# Patient Record
Sex: Female | Born: 1975 | Race: White | Hispanic: No | Marital: Married | Smoking: Never smoker
Health system: Southern US, Community
[De-identification: ages and names within clinical notes are randomized; demographics above are authoritative.]

## PROBLEM LIST (undated history)

## (undated) DIAGNOSIS — N879 Dysplasia of cervix uteri, unspecified: Secondary | ICD-10-CM

## (undated) HISTORY — PX: CRYOTHERAPY: SHX1416

## (undated) HISTORY — DX: Dysplasia of cervix uteri, unspecified: N87.9

## (undated) HISTORY — PX: HIP SURGERY: SHX245

---

## 2013-09-07 ENCOUNTER — Ambulatory Visit: Payer: Self-pay | Admitting: Podiatry

## 2015-09-16 ENCOUNTER — Encounter: Payer: Self-pay | Admitting: Sports Medicine

## 2015-09-16 ENCOUNTER — Ambulatory Visit (INDEPENDENT_AMBULATORY_CARE_PROVIDER_SITE_OTHER): Payer: BC Managed Care – PPO | Admitting: Sports Medicine

## 2015-09-16 ENCOUNTER — Ambulatory Visit (INDEPENDENT_AMBULATORY_CARE_PROVIDER_SITE_OTHER): Payer: BC Managed Care – PPO

## 2015-09-16 DIAGNOSIS — M79673 Pain in unspecified foot: Secondary | ICD-10-CM | POA: Diagnosis not present

## 2015-09-16 DIAGNOSIS — M779 Enthesopathy, unspecified: Secondary | ICD-10-CM

## 2015-09-16 DIAGNOSIS — L603 Nail dystrophy: Secondary | ICD-10-CM

## 2015-09-16 MED ORDER — MELOXICAM 15 MG PO TABS: 15.0000 mg | ORAL_TABLET | Freq: Every day | ORAL | Status: DC

## 2015-09-16 NOTE — Progress Notes (Signed)
Patient ID: April Leach, female   DOB: 1975-09-17, 40 y.o.   MRN: [id number] Subjective: April Leach is a 40 y.o. female patient who presents to office for evaluation of yellow and discolored nails and occassional bilateral foot pain. Patient states that a few years ago she was put on lamisil and the nails cleared up, states that 1.5 year ago noticed her nails starting to change again and was put on another course of lamisil with no improvement. Patinet states that she had a past + fungus culture. Patient also admits to chronic pain for many years to the sides of both feet only when she walks; reports that it feels sharp to achy; Denies injury/trip/fall/sprain/any known causative factors.   There are no active problems to display for this patient.  No current outpatient prescriptions on file prior to visit.   No current facility-administered medications on file prior to visit.   Allergies  Allergen Reactions  . Meperidine Itching    Objective:  General: Alert and oriented x3 in no acute distress  Dermatology: No open lesions bilateral lower extremities, no webspace macerations, no ecchymosis bilateral, all nails x 10 are mildly discolored with minimal subungal debris with hallux and 2nd toe nails most involved bilateral.  Vascular: Dorsalis Pedis and Posterior Tibial pedal pulses 2/4, Capillary Fill Time 3 seconds,(+) pedal hair growth bilateral, no edema bilateral lower extremities, Temperature gradient within normal limits.  Neurology: Gross sensation intact via light touch bilateral. (- )Tinels sign.   Musculoskeletal: Mild tenderness with palpation at insertion of peroneus brevis bilateral,No pain with calf compression bilateral. Ankle and pedal joint range of motion is within normal limits, there is no 1st ray hypermobility noted bilateral ,normal arch and foot type. Strength within normal limits in all groups bilateral.   Gait: Non-Antalgic gait with increased lateral column  loading.  Xrays  Right and Left Foot 2 views    Impression: Normal osseous mineralization, joint spaces well preserved, no fracture, dislocation, normal bipartite sesamoid, soft tissues within normal limits. No foreign bodies, no acute findings.        Assessment and Plan: Problem List Items Addressed This Visit    None    Visit Diagnoses    Nail dystrophy    -  Primary    Relevant Orders    Fungus Culture with Smear    Foot pain, unspecified laterality        Relevant Medications    meloxicam (MOBIC) 15 MG tablet    Other Relevant Orders    DG Foot 2 Views Left    DG Foot 2 Views Right    Fungus Culture with Smear    Tendonitis        PB bilateral    Relevant Medications    meloxicam (MOBIC) 15 MG tablet    Other Relevant Orders    Fungus Culture with Smear       -Complete examination performed -Xrays reviewed -Discussed treatement options -Fungal culture obtained from all nails and sent to [facility]; will determine treatment based on results -Rx Mobic for PB tendonitis -Recommend good supportive shoes and inserts daily. If continues to be symptomatic advised patient to consider custom orthotics -Patient to return to office 3 weeks to discuss symptoms and fungal results or sooner if condition worsens.  Landis Martins, DPM

## 2015-09-16 NOTE — Progress Notes (Deleted)
   Subjective:    Patient ID: April Leach, female    DOB: 05/12/1976, 40 y.o.   MRN: [id number]  HPI    Review of Systems  All other systems reviewed and are negative.      Objective:   Physical Exam        Assessment & Plan:

## 2015-10-04 ENCOUNTER — Telehealth: Payer: Self-pay | Admitting: *Deleted

## 2015-10-04 NOTE — Telephone Encounter (Addendum)
Pt states she is cancelling 10/07/2015 appt due to scheduling conflict and would like to discuss results and treatment without an appt.  I called for [place] fungal culture results to present to Dr. Cannon Kettle.  10/05/2015-LEFT MESSAGE encouraging pt to reschedule an appt with Dr. Cannon Kettle to discuss results and treatment.

## 2015-10-07 ENCOUNTER — Ambulatory Visit: Payer: BC Managed Care – PPO | Admitting: Sports Medicine

## 2015-10-26 ENCOUNTER — Encounter: Payer: Self-pay | Admitting: Sports Medicine

## 2020-05-03 ENCOUNTER — Telehealth: Payer: Self-pay | Admitting: Obstetrics and Gynecology

## 2020-05-03 NOTE — Telephone Encounter (Signed)
Patient coming in on 05/09/2020 at 2:10 with [facility] for mirena removal and placement

## 2020-05-04 NOTE — Telephone Encounter (Signed)
Noted. Mirena reserved for this patient. 

## 2020-05-09 ENCOUNTER — Ambulatory Visit (INDEPENDENT_AMBULATORY_CARE_PROVIDER_SITE_OTHER): Payer: 59 | Admitting: Obstetrics and Gynecology

## 2020-05-09 ENCOUNTER — Encounter: Payer: Self-pay | Admitting: Obstetrics and Gynecology

## 2020-05-09 ENCOUNTER — Other Ambulatory Visit (HOSPITAL_COMMUNITY)
Admission: RE | Admit: 2020-05-09 | Discharge: 2020-05-09 | Disposition: A | Discharge status: Home / Self Care | Payer: 59 | Source: Ambulatory Visit | Attending: Obstetrics and Gynecology | Admitting: Obstetrics and Gynecology

## 2020-05-09 ENCOUNTER — Other Ambulatory Visit: Payer: Self-pay

## 2020-05-09 VITALS — BP 140/90 | Ht 69.0 in | Wt 224.0 lb

## 2020-05-09 DIAGNOSIS — Z124 Encounter for screening for malignant neoplasm of cervix: Secondary | ICD-10-CM

## 2020-05-09 DIAGNOSIS — Z1151 Encounter for screening for human papillomavirus (HPV): Secondary | ICD-10-CM | POA: Diagnosis not present

## 2020-05-09 DIAGNOSIS — Z30433 Encounter for removal and reinsertion of intrauterine contraceptive device: Secondary | ICD-10-CM

## 2020-05-09 LAB — POCT URINE PREGNANCY: Preg Test, Ur: NEGATIVE

## 2020-05-09 MED ORDER — LEVONORGESTREL 20 MCG/24HR IU IUD: 1.0000 | INTRAUTERINE_SYSTEM | Freq: Once | INTRAUTERINE | 0 refills | Status: AC

## 2020-05-09 NOTE — Patient Instructions (Addendum)
I value your feedback and entrusting us with your care. If you get a [facility] patient survey, I would appreciate you taking the time to let us know about your experience today. Thank you!  As of August 06, 2019, your lab results will be released to your MyChart immediately, before I even have a chance to see them. Please give me time to review them and contact you if there are any abnormalities. Thank you for your patience.   [facility] [phone]  Instructions after IUD insertion  Most women experience no significant problems after insertion of an IUD, however minor cramping and spotting for a few days is common. Cramps may be treated with ibuprofen 800mg every 8 hours or Tylenol 650 mg every 4 hours. Contact [facility] immediately if you experience any of the following symptoms during the next week: temperature >99.6 degrees, worsening pelvic pain, abdominal pain, fainting, unusually heavy vaginal bleeding, foul vaginal discharge, or if you think you have expelled the IUD.  Nothing inserted in the vagina for 48 hours. You will be scheduled for a follow up visit in approximately four weeks.  You should check monthly to be sure you can feel the IUD strings in the upper vagina. If you are having a monthly period, try to check after each period. If you cannot feel the IUD strings,  contact [facility] immediately so we can do an exam to determine if the IUD has been expelled.   Please use backup protection until we can confirm the IUD is in place.  Call [facility] if you are exposed to or diagnosed with a sexually transmitted infection, as we will need to discuss whether it is safe for you to continue using an IUD.   

## 2020-05-09 NOTE — Progress Notes (Signed)
[place], [facility]   Chief Complaint  Patient presents with  . Contraception    Mirena removal/reinsertion    HPI:      [name] is a 44 y.o. G1P1001 whose LMP was Patient's last menstrual period was 04/22/2020 (exact date)., presents today for NP> 3 yrs Mirena replacement. Mirena REplaced 01/01/14. Pt has monthly menses, lasting 5-7 days, mod flow, occas BTB recently (since IUD expired), and mild dysmen, improved with ibup. She would like replacement. She is sex active, no pain/bleeding. Last pap 10/29/08 was neg cells, neg HPV DNA. Hx of cryotherapy in past. No recent pap. No recent annual or mammo.    Past Medical History:  Diagnosis Date  . Cervical dysplasia     Past Surgical History:  Procedure Laterality Date  . CRYOTHERAPY    . HIP SURGERY     x2    Family History  Problem Relation Age of Onset  . Cervical cancer Mother 88  . Hypertension Mother   . Lung cancer Father 64  . Parkinson's disease Maternal Grandmother        Family History  . Melanoma Paternal Grandfather     Social History   Socioeconomic History  . Marital status: Married    Spouse name: Not on file  . Number of children: Not on file  . Years of education: Not on file  . Highest education level: Not on file  Occupational History  . Not on file  Tobacco Use  . Smoking status: Never Smoker  . Smokeless tobacco: Never Used  Vaping Use  . Vaping Use: Never used  Substance and Sexual Activity  . Alcohol use: Not Currently    Alcohol/week: 0.0 standard drinks  . Drug use: Never  . Sexual activity: Yes    Birth control/protection: I.U.D.    Comment: Mirena  Other Topics Concern  . Not on file  Social History Narrative  . Not on file   Social Determinants of Health   Financial Resource Strain:   . Difficulty of Paying Living Expenses: Not on file  Food Insecurity:   . Worried About Charity fundraiser in the Last Year: Not on file  . Ran Out of Food in the Last  Year: Not on file  Transportation Leach:   . Lack of Transportation (Medical): Not on file  . Lack of Transportation (Non-Medical): Not on file  Physical Activity:   . Days of Exercise per Week: Not on file  . Minutes of Exercise per Session: Not on file  Stress:   . Feeling of Stress : Not on file  Social Connections:   . Frequency of Communication with Friends and Family: Not on file  . Frequency of Social Gatherings with Friends and Family: Not on file  . Attends Religious Services: Not on file  . Active Member of Clubs or Organizations: Not on file  . Attends Archivist Meetings: Not on file  . Marital Status: Not on file  Intimate Partner Violence:   . Fear of Current or Ex-Partner: Not on file  . Emotionally Abused: Not on file  . Physically Abused: Not on file  . Sexually Abused: Not on file    Outpatient Medications Prior to Visit  Medication Sig Dispense Refill  . levonorgestrel (MIRENA) 20 MCG/24HR IUD 1 each by Intrauterine route once.    . Cetirizine HCl 10 MG CAPS Take by mouth.    . meloxicam (MOBIC) 15 MG tablet Take 1  tablet (15 mg total) by mouth daily. 30 tablet 0   No facility-administered medications prior to visit.    ROS:  Review of Systems  Constitutional: Negative for fever.  Gastrointestinal: Negative for blood in stool, constipation, diarrhea, nausea and vomiting.  Genitourinary: Negative for dyspareunia, dysuria, flank pain, frequency, hematuria, urgency, vaginal bleeding, vaginal discharge and vaginal pain.  Musculoskeletal: Negative for back pain.  Skin: Negative for rash.  BREAST: No symptoms   OBJECTIVE:   Vitals:  BP 140/90   Ht 5\' 9"  (1.753 m)   Wt 224 lb (101.6 kg)   LMP 04/22/2020 (Exact Date)   BMI 33.08 kg/m   Physical Exam Vitals reviewed.  Constitutional:      Appearance: She is well-developed. She is not ill-appearing or toxic-appearing.  Pulmonary:     Effort: Pulmonary effort is normal.  Genitourinary:     General: Normal vulva.     Pubic Area: No rash.      Labia:        Right: No rash, tenderness or lesion.        Left: No rash, tenderness or lesion.      Vagina: Bleeding present. No vaginal discharge, erythema or tenderness.     Uterus: Normal. Not enlarged and not tender.      Adnexa: Right adnexa normal and left adnexa normal.       Right: No mass or tenderness.         Left: No mass or tenderness.          Comments: BTB FROM MIRENA PRESENT AT CX OS; IUD STRINGS IN CX OS Musculoskeletal:        General: Normal range of motion.     Cervical back: Normal range of motion.  Skin:    General: Skin is warm and dry.  Neurological:     General: No focal deficit present.     Mental Status: She is alert and oriented to person, place, and time.     Cranial Nerves: No cranial nerve deficit.  Psychiatric:        Mood and Affect: Mood normal.        Behavior: Behavior normal.        Thought Content: Thought content normal.        Judgment: Judgment normal.    IUD Removal Strings of IUD identified and grasped.  IUD removed without problem with ring forceps.  Pt tolerated this well.  IUD noted to be intact.   IUD Insertion Procedure Note Patient identified, informed consent performed, consent signed.   Discussed risks of irregular bleeding, cramping, infection, malpositioning or misplacement of the IUD outside the uterus which may require further procedure such as laparoscopy, risk of failure <1%. Time out was performed.  Urine pregnancy test negative.  Speculum placed in the vagina.  Cervix visualized.  Cleaned with Betadine x 2.  Grasped anteriorly with a single tooth tenaculum.  Uterus sounded to 8.0 cm.   IUD placed per manufacturer's recommendations.  Strings trimmed to 3 cm. Tenaculum was removed, good hemostasis noted.  Patient tolerated procedure well.    Results: Results for orders placed or performed in visit on 05/09/20 (from the past 24 hour(s))  POCT urine pregnancy      Status: Normal   Collection Time: 05/09/20  2:24 PM  Result Value Ref Range   Preg Test, Ur Negative Negative     Assessment/Plan: Encounter for removal and reinsertion of intrauterine contraceptive device (IUD) - Plan: POCT urine pregnancy, levonorgestrel (  MIRENA) 20 MCG/24HR IUD  Cervical cancer screening - Plan: Cytology - PAP  Screening for HPV (human papillomavirus) - Plan: Cytology - PAP  History of cervical dysplasia--repeat pap due today.   Meds ordered this encounter  Medications  . levonorgestrel (MIRENA) 20 MCG/24HR IUD    Sig: 1 Intra Uterine Device (1 each total) by Intrauterine route once for 1 dose.    Dispense:  1 each    Refill:  0    Order Specific Question:   Supervising Provider    Answer:   Gae Dry [[id number]]   Plan:  Patient was given post-procedure instructions.  She was advised to have backup contraception for one week.   Call if you are having increasing pain, cramps or bleeding or if you have a fever greater than 100.4 degrees F., shaking chills, nausea or vomiting. Patient was also asked to check IUD strings periodically and follow up in 4 weeks for IUD check.    Return in about 4 weeks (around 06/06/2020) for annual, IUD f/u.  Klair Leising B. Japleen Tornow, PA-C 05/09/2020 2:48 PM

## 2020-05-11 LAB — CYTOLOGY - PAP
Comment: NEGATIVE
Diagnosis: NEGATIVE
High risk HPV: NEGATIVE

## 2020-06-07 ENCOUNTER — Encounter: Payer: Self-pay | Admitting: Obstetrics and Gynecology

## 2020-06-07 ENCOUNTER — Other Ambulatory Visit: Payer: Self-pay

## 2020-06-07 ENCOUNTER — Ambulatory Visit (INDEPENDENT_AMBULATORY_CARE_PROVIDER_SITE_OTHER): Payer: 59 | Admitting: Obstetrics and Gynecology

## 2020-06-07 VITALS — BP 140/90 | Ht 69.0 in | Wt 224.0 lb

## 2020-06-07 DIAGNOSIS — Z01419 Encounter for gynecological examination (general) (routine) without abnormal findings: Secondary | ICD-10-CM | POA: Diagnosis not present

## 2020-06-07 DIAGNOSIS — Z30431 Encounter for routine checking of intrauterine contraceptive device: Secondary | ICD-10-CM | POA: Diagnosis not present

## 2020-06-07 DIAGNOSIS — Z1231 Encounter for screening mammogram for malignant neoplasm of breast: Secondary | ICD-10-CM | POA: Diagnosis not present

## 2020-06-07 NOTE — Patient Instructions (Addendum)
I value your feedback and entrusting us with your care. If you get a [facility] patient survey, I would appreciate you taking the time to let us know about your experience today. Thank you!  As of August 06, 2019, your lab results will be released to your MyChart immediately, before I even have a chance to see them. Please give me time to review them and contact you if there are any abnormalities. Thank you for your patience.   [facility]: [phone]  [facility]: [phone]  

## 2020-06-07 NOTE — Progress Notes (Signed)
PCP:  [facility]   Chief Complaint  Patient presents with   Gynecologic Exam     HPI:      [name] is a 44 y.o. G1P1001 whose LMP was No LMP recorded. (Menstrual status: IUD)., presents today for her annual examination.  Her menses are usually regular every 28-30 days, lasting 7 days with IUD. Had expired IUD replaced 9/21. No bleeding yet.  Dysmenorrhea mild. She does not have intermenstrual bleeding.  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 There is no FH of ovarian cancer. The patient does do self-breast exams.  Tobacco use: The patient denies current or previous tobacco use. Alcohol use: none No drug use.  Exercise: moderately active  She does get adequate calcium but not Vitamin D in her diet.  Labs at work.    [employer] - 06/07/20 1353      Pregnancy Intention Screening   Does the patient want to become pregnant in the next year? No    Does the patient's partner want to become pregnant in the next year? No    Would the patient like to discuss contraceptive options today? Yes      Contraception Wrap Up   Current Method IUD or IUS          The pregnancy intention screening data noted above was reviewed. Potential methods of contraception were discussed. The patient elected to proceed with IUD or IUS.     Past Medical History:  Diagnosis Date   Cervical dysplasia     Past Surgical History:  Procedure Laterality Date   CRYOTHERAPY     HIP SURGERY     x2    Family History  Problem Relation Age of Onset   Cervical cancer Mother 16   Hypertension Mother    Lung cancer Father 46   Parkinson's disease Maternal Grandmother        Family History   Melanoma Paternal Grandfather     Social History   Socioeconomic  History   Marital status: Married    Spouse name: Not on file   Number of children: Not on file   Years of education: Not on file   Highest education level: Not on file  Occupational History   Not on file  Tobacco Use   Smoking status: Never Smoker   Smokeless tobacco: Never Used  Vaping Use   Vaping Use: Never used  Substance and Sexual Activity   Alcohol use: Not Currently    Alcohol/week: 0.0 standard drinks   Drug use: Never   Sexual activity: Yes    Birth control/protection: I.U.D.    Comment: Mirena  Other Topics Concern   Not on file  Social History Narrative   Not on file   Social Determinants of Health   Financial Resource Strain:    Difficulty of Paying Living Expenses: Not on file  Food Insecurity:    Worried About Charity fundraiser in the Last Year: Not on file   [employer] of Food in the Last Year: Not on file  Transportation Needs:    Lack of Transportation (Medical): Not on file   Lack of Transportation (Non-Medical): Not on file  Physical Activity:    Days of Exercise per Week: Not on file  Minutes of Exercise per Session: Not on file  Stress:    Feeling of Stress : Not on file  Social Connections:    Frequency of Communication with Friends and Family: Not on file   Frequency of Social Gatherings with Friends and Family: Not on file   Attends Religious Services: Not on file   Active Member of Clubs or Organizations: Not on file   Attends Archivist Meetings: Not on file   Marital Status: Not on file  Intimate Partner Violence:    Fear of Current or Ex-Partner: Not on file   Emotionally Abused: Not on file   Physically Abused: Not on file   Sexually Abused: Not on file     Current Outpatient Medications:    levonorgestrel (MIRENA) 20 MCG/24HR IUD, 1 Intra Uterine Device (1 each total) by Intrauterine route once for 1 dose., Disp: 1 each, Rfl: 0     ROS:  Review of Systems  Constitutional: Negative  for fatigue, fever and unexpected weight change.  Respiratory: Negative for cough, shortness of breath and wheezing.   Cardiovascular: Negative for chest pain, palpitations and leg swelling.  Gastrointestinal: Negative for blood in stool, constipation, diarrhea, nausea and vomiting.  Endocrine: Negative for cold intolerance, heat intolerance and polyuria.  Genitourinary: Negative for dyspareunia, dysuria, flank pain, frequency, genital sores, hematuria, menstrual problem, pelvic pain, urgency, vaginal bleeding, vaginal discharge and vaginal pain.  Musculoskeletal: Negative for back pain, joint swelling and myalgias.  Skin: Negative for rash.  Neurological: Negative for dizziness, syncope, light-headedness, numbness and headaches.  Hematological: Negative for adenopathy.  Psychiatric/Behavioral: Negative for agitation, confusion, sleep disturbance and suicidal ideas. The patient is not nervous/anxious.   BREAST: No symptoms   Objective: BP 140/90    Ht 5\' 9"  (1.753 m)    Wt 224 lb (101.6 kg)    BMI 33.08 kg/m    Physical Exam Constitutional:      Appearance: She is well-developed.  Genitourinary:     Vulva, vagina, cervix, uterus, right adnexa and left adnexa normal.     No vulval lesion or tenderness noted.     No vaginal discharge, erythema or tenderness.     No cervical polyp.     IUD strings visualized.     Uterus is not enlarged or tender.     No right or left adnexal mass present.     Right adnexa not tender.     Left adnexa not tender.  Neck:     Thyroid: No thyromegaly.  Cardiovascular:     Rate and Rhythm: Normal rate and regular rhythm.     Heart sounds: Normal heart sounds. No murmur heard.   Pulmonary:     Effort: Pulmonary effort is normal.     Breath sounds: Normal breath sounds.  Chest:     Breasts:        Right: No mass, nipple discharge, skin change or tenderness.        Left: No mass, nipple discharge, skin change or tenderness.  Abdominal:      Palpations: Abdomen is soft.     Tenderness: There is no abdominal tenderness. There is no guarding.  Musculoskeletal:        General: Normal range of motion.     Cervical back: Normal range of motion.  Neurological:     General: No focal deficit present.     Mental Status: She is alert and oriented to person, place, and time.     Cranial Nerves: No  cranial nerve deficit.  Skin:    General: Skin is warm and dry.  Psychiatric:        Mood and Affect: Mood normal.        Behavior: Behavior normal.        Thought Content: Thought content normal.        Judgment: Judgment normal.  Vitals reviewed.     Assessment/Plan: Encounter for annual routine gynecological examination  Encounter for routine checking of intrauterine contraceptive device (IUD)--pt doing well, IUD strings in cx os  Encounter for screening mammogram for malignant neoplasm of breast - Plan: MM 3D SCREEN BREAST BILATERAL; pt to sched mammo       GYN counsel breast self exam, mammography screening, adequate intake of calcium and vitamin D, diet and exercise     F/U  Return in about 1 year (around 06/07/2021).  Baljit Liebert B. Duffy Dantonio, PA-C 06/07/2020 2:30 PM

## 2020-07-05 ENCOUNTER — Ambulatory Visit
Admission: RE | Admit: 2020-07-05 | Discharge: 2020-07-05 | Disposition: A | Discharge status: Home / Self Care | Payer: 59 | Source: Ambulatory Visit | Attending: Obstetrics and Gynecology | Admitting: Obstetrics and Gynecology

## 2020-07-05 ENCOUNTER — Other Ambulatory Visit: Payer: Self-pay

## 2020-07-05 DIAGNOSIS — Z1231 Encounter for screening mammogram for malignant neoplasm of breast: Secondary | ICD-10-CM | POA: Diagnosis not present

## 2020-07-07 ENCOUNTER — Other Ambulatory Visit: Payer: Self-pay | Admitting: Obstetrics and Gynecology

## 2020-07-07 DIAGNOSIS — N6489 Other specified disorders of breast: Secondary | ICD-10-CM

## 2020-07-07 DIAGNOSIS — R928 Other abnormal and inconclusive findings on diagnostic imaging of breast: Secondary | ICD-10-CM

## 2020-07-07 DIAGNOSIS — N6320 Unspecified lump in the left breast, unspecified quadrant: Secondary | ICD-10-CM

## 2020-07-25 ENCOUNTER — Ambulatory Visit
Admission: RE | Admit: 2020-07-25 | Discharge: 2020-07-25 | Disposition: A | Discharge status: Home / Self Care | Payer: 59 | Source: Ambulatory Visit | Attending: Obstetrics and Gynecology | Admitting: Obstetrics and Gynecology

## 2020-07-25 ENCOUNTER — Other Ambulatory Visit: Payer: Self-pay

## 2020-07-25 DIAGNOSIS — N6320 Unspecified lump in the left breast, unspecified quadrant: Secondary | ICD-10-CM

## 2020-07-25 DIAGNOSIS — N6489 Other specified disorders of breast: Secondary | ICD-10-CM

## 2020-07-25 DIAGNOSIS — R928 Other abnormal and inconclusive findings on diagnostic imaging of breast: Secondary | ICD-10-CM

## 2021-02-09 ENCOUNTER — Encounter: Payer: Self-pay | Admitting: Obstetrics and Gynecology

## 2021-02-09 ENCOUNTER — Ambulatory Visit
Admission: RE | Admit: 2021-02-09 | Discharge: 2021-02-09 | Disposition: A | Discharge status: Home / Self Care | Payer: 59 | Source: Ambulatory Visit | Attending: Obstetrics and Gynecology | Admitting: Obstetrics and Gynecology

## 2021-02-09 ENCOUNTER — Other Ambulatory Visit: Payer: Self-pay

## 2021-02-09 ENCOUNTER — Other Ambulatory Visit: Payer: Self-pay | Admitting: Obstetrics and Gynecology

## 2021-02-09 DIAGNOSIS — N6320 Unspecified lump in the left breast, unspecified quadrant: Secondary | ICD-10-CM | POA: Insufficient documentation

## 2021-02-09 DIAGNOSIS — Z1231 Encounter for screening mammogram for malignant neoplasm of breast: Secondary | ICD-10-CM

## 2021-02-09 DIAGNOSIS — R928 Other abnormal and inconclusive findings on diagnostic imaging of breast: Secondary | ICD-10-CM

## 2021-06-23 NOTE — Telephone Encounter (Signed)
Mirena rcvd/charged 05/09/20

## 2021-08-14 ENCOUNTER — Other Ambulatory Visit: Payer: Self-pay

## 2021-08-14 ENCOUNTER — Ambulatory Visit
Admission: RE | Admit: 2021-08-14 | Discharge: 2021-08-14 | Disposition: A | Discharge status: Home / Self Care | Payer: 59 | Source: Ambulatory Visit | Attending: Obstetrics and Gynecology | Admitting: Obstetrics and Gynecology

## 2021-08-14 DIAGNOSIS — Z1231 Encounter for screening mammogram for malignant neoplasm of breast: Secondary | ICD-10-CM | POA: Diagnosis present

## 2021-08-14 DIAGNOSIS — R928 Other abnormal and inconclusive findings on diagnostic imaging of breast: Secondary | ICD-10-CM | POA: Diagnosis present

## 2021-08-23 ENCOUNTER — Ambulatory Visit: Payer: 59 | Admitting: Obstetrics and Gynecology

## 2021-09-05 ENCOUNTER — Ambulatory Visit (INDEPENDENT_AMBULATORY_CARE_PROVIDER_SITE_OTHER): Payer: 59 | Admitting: Obstetrics and Gynecology

## 2021-09-05 ENCOUNTER — Other Ambulatory Visit: Payer: Self-pay

## 2021-09-05 ENCOUNTER — Encounter: Payer: Self-pay | Admitting: Obstetrics and Gynecology

## 2021-09-05 VITALS — BP 140/90 | Ht 69.0 in | Wt 246.0 lb

## 2021-09-05 DIAGNOSIS — Z1211 Encounter for screening for malignant neoplasm of colon: Secondary | ICD-10-CM | POA: Diagnosis not present

## 2021-09-05 DIAGNOSIS — Z1231 Encounter for screening mammogram for malignant neoplasm of breast: Secondary | ICD-10-CM

## 2021-09-05 DIAGNOSIS — R899 Unspecified abnormal finding in specimens from other organs, systems and tissues: Secondary | ICD-10-CM

## 2021-09-05 DIAGNOSIS — K921 Melena: Secondary | ICD-10-CM

## 2021-09-05 DIAGNOSIS — Z131 Encounter for screening for diabetes mellitus: Secondary | ICD-10-CM

## 2021-09-05 DIAGNOSIS — Z30431 Encounter for routine checking of intrauterine contraceptive device: Secondary | ICD-10-CM

## 2021-09-05 DIAGNOSIS — Z0000 Encounter for general adult medical examination without abnormal findings: Secondary | ICD-10-CM

## 2021-09-05 DIAGNOSIS — Z6836 Body mass index (BMI) 36.0-36.9, adult: Secondary | ICD-10-CM

## 2021-09-05 DIAGNOSIS — Z01419 Encounter for gynecological examination (general) (routine) without abnormal findings: Secondary | ICD-10-CM | POA: Diagnosis not present

## 2021-09-05 DIAGNOSIS — Z13220 Encounter for screening for lipoid disorders: Secondary | ICD-10-CM

## 2021-09-05 NOTE — Progress Notes (Signed)
PCP:  [facility]   Chief Complaint  Patient presents with   Gynecologic Exam    No concerns    HPI:      [name] is a 46 y.o. G1P1001 whose LMP was No LMP recorded. (Menstrual status: IUD)., presents today for her annual examination.  Her menses are Q1-2 months with IUD, light spotting/bleeding for a few days.  Dysmenorrhea none, no BTB.  Sex activity: single partner, contraception -  Mirena replaced 05/09/20. No pain/bleeding. 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 There is no FH of breast cancer. There is no FH of ovarian cancer. The patient does self-breast exams.  Tobacco use: The patient denies current or previous tobacco use. Alcohol use: none No drug use.  Exercise: min active  Colonoscopy: never; has been having blood in stool with a little pain, each BM for 2-3 months. No FH colon cancer.   She does get adequate calcium and Vitamin D in her diet.  Labs at work in 2021; not fasting today.   Past Medical History:  Diagnosis Date   Cervical dysplasia     Past Surgical History:  Procedure Laterality Date   CRYOTHERAPY     HIP SURGERY     x2    Family History  Problem Relation Age of Onset   Cervical cancer Mother 32   Hypertension Mother    Lung cancer Father 68   Parkinson's disease Maternal Grandmother        Family History   Melanoma Paternal Grandfather    Breast cancer Neg Hx     Social History   Socioeconomic History   Marital status: Married    Spouse name: Not on file   Number of children: Not on file   Years of education: Not on file   Highest education level: Not on file  Occupational History   Not on file  Tobacco Use   Smoking status: Never   Smokeless tobacco: Never  Vaping Use   Vaping Use: Never used  Substance and Sexual Activity    Alcohol use: Not Currently    Alcohol/week: 0.0 standard drinks   Drug use: Never   Sexual activity: Yes    Birth control/protection: I.U.D.    Comment: Mirena  Other Topics Concern   Not on file  Social History Narrative   Not on file   Social Determinants of Health   Financial Resource Strain: Not on file  Food Insecurity: Not on file  Transportation Needs: Not on file  Physical Activity: Not on file  Stress: Not on file  Social Connections: Not on file  Intimate Partner Violence: Not on file     Current Outpatient Medications:    levonorgestrel (MIRENA) 20 MCG/24HR IUD, 1 Intra Uterine Device (1 each total) by Intrauterine route once for 1 dose., Disp: 1 each, Rfl: 0    ROS:  Review of Systems  Constitutional:  Negative for fatigue, fever and unexpected weight change.  Respiratory:  Negative for cough, shortness of breath and wheezing.   Cardiovascular:  Negative for chest pain, palpitations and leg swelling.  Gastrointestinal:  Positive for blood in stool. Negative for constipation, diarrhea, nausea and vomiting.  Endocrine: Negative for cold intolerance, heat intolerance and polyuria.  Genitourinary:  Negative for dyspareunia, dysuria, flank pain, frequency, genital  sores, hematuria, menstrual problem, pelvic pain, urgency, vaginal bleeding, vaginal discharge and vaginal pain.  Musculoskeletal:  Negative for back pain, joint swelling and myalgias.  Skin:  Negative for rash.  Neurological:  Negative for dizziness, syncope, light-headedness, numbness and headaches.  Hematological:  Negative for adenopathy.  Psychiatric/Behavioral:  Negative for agitation, confusion, sleep disturbance and suicidal ideas. The patient is not nervous/anxious.  BREAST: No symptoms   Objective: BP 140/90    Ht 5\' 9"  (1.753 m)    Wt 246 lb (111.6 kg)    BMI 36.33 kg/m    Physical Exam Constitutional:      Appearance: She is well-developed.  Genitourinary:     Vulva normal.     Right  Labia: No rash, tenderness or lesions.    Left Labia: No tenderness, lesions or rash.    No vaginal discharge, erythema or tenderness.      Right Adnexa: not tender and no mass present.    Left Adnexa: not tender and no mass present.    No cervical friability or polyp.     IUD strings visualized.     Uterus is not enlarged or tender.  Breasts:    Right: No mass, nipple discharge, skin change or tenderness.     Left: No mass, nipple discharge, skin change or tenderness.  Neck:     Thyroid: No thyromegaly.  Cardiovascular:     Rate and Rhythm: Normal rate and regular rhythm.     Heart sounds: Normal heart sounds. No murmur heard. Pulmonary:     Effort: Pulmonary effort is normal.     Breath sounds: Normal breath sounds.  Abdominal:     Palpations: Abdomen is soft.     Tenderness: There is no abdominal tenderness. There is no guarding or rebound.  Musculoskeletal:        General: Normal range of motion.     Cervical back: Normal range of motion.  Lymphadenopathy:     Cervical: No cervical adenopathy.  Neurological:     General: No focal deficit present.     Mental Status: She is alert and oriented to person, place, and time.     Cranial Nerves: No cranial nerve deficit.  Skin:    General: Skin is warm and dry.  Psychiatric:        Mood and Affect: Mood normal.        Behavior: Behavior normal.        Thought Content: Thought content normal.        Judgment: Judgment normal.  Vitals reviewed.    Assessment/Plan: Encounter for annual routine gynecological examination  Encounter for routine checking of intrauterine contraceptive device (IUD); IUD strings in cx os, has 8 yr indication now  Encounter for screening mammogram for malignant neoplasm of breast - Plan: MM 3D SCREEN BREAST BILATERAL; pt current on mammo till 12/23  Screening for colon cancer - Plan: Ambulatory referral to Gastroenterology; refer to GI   Blood in stool - Plan: Ambulatory referral to  Gastroenterology; refer to GI for colonoscopy due to age/sx  Blood tests for routine general physical examination - Plan: Comprehensive metabolic panel, Lipid panel, Hemoglobin A1c, Hemoglobin A1c, Lipid panel, Comprehensive metabolic panel  Screening cholesterol level - Plan: Lipid panel, Lipid panel  Screening for diabetes mellitus - Plan: Hemoglobin A1c, Hemoglobin A1c  BMI 36.0-36.9,adult - Plan: Comprehensive metabolic panel, Lipid panel, Hemoglobin A1c, Hemoglobin A1c, Lipid panel, Comprehensive metabolic panel  GYN counsel breast self exam, mammography screening, adequate intake of calcium  and vitamin D, diet and exercise     F/U  Return in about 1 year (around 09/05/2022).  April Leach B. April Otting, PA-C 09/05/2021 11:09 AM

## 2021-09-05 NOTE — Patient Instructions (Signed)
I value your feedback and you entrusting us with your care. If you get a [facility] patient survey, I would appreciate you taking the time to let us know about your experience today. Thank you! ? ? ?

## 2021-09-07 ENCOUNTER — Telehealth: Payer: Self-pay

## 2021-09-07 NOTE — Telephone Encounter (Signed)
Made appointment for march 8 at 315

## 2021-09-08 LAB — COMPREHENSIVE METABOLIC PANEL
ALT: 14 IU/L (ref 0–32)
AST: 17 IU/L (ref 0–40)
Albumin/Globulin Ratio: 1.8 (ref 1.2–2.2)
Albumin: 4.2 g/dL (ref 3.8–4.8)
Alkaline Phosphatase: 58 IU/L (ref 44–121)
BUN/Creatinine Ratio: 8 — ABNORMAL LOW (ref 9–23)
BUN: 9 mg/dL (ref 6–24)
Bilirubin Total: 0.3 mg/dL (ref 0.0–1.2)
CO2: 23 mmol/L (ref 20–29)
Calcium: 9.5 mg/dL (ref 8.7–10.2)
Chloride: 100 mmol/L (ref 96–106)
Creatinine, Ser: 1.1 mg/dL — ABNORMAL HIGH (ref 0.57–1.00)
Globulin, Total: 2.4 g/dL (ref 1.5–4.5)
Glucose: 92 mg/dL (ref 70–99)
Potassium: 4.7 mmol/L (ref 3.5–5.2)
Sodium: 139 mmol/L (ref 134–144)
Total Protein: 6.6 g/dL (ref 6.0–8.5)
eGFR: 63 mL/min/{1.73_m2} (ref >59)

## 2021-09-08 LAB — HEMOGLOBIN A1C
Est. average glucose Bld gHb Est-mCnc: 100 mg/dL
Hgb A1c MFr Bld: 5.1 % (ref 4.8–5.6)

## 2021-09-08 LAB — LIPID PANEL
Chol/HDL Ratio: 3.8 ratio (ref 0.0–4.4)
Cholesterol, Total: 224 mg/dL — ABNORMAL HIGH (ref 100–199)
HDL: 59 mg/dL (ref >39)
LDL Chol Calc (NIH): 150 mg/dL — ABNORMAL HIGH (ref 0–99)
Triglycerides: 83 mg/dL (ref 0–149)
VLDL Cholesterol Cal: 15 mg/dL (ref 5–40)

## 2021-09-12 NOTE — Addendum Note (Signed)
Addended by: Ardeth Perfect B on: 0/38/8828 08:18 AM   Modules accepted: Orders

## 2021-09-15 IMAGING — US US BREAST*L* LIMITED INC AXILLA
1 series · 8 of 8 positions shown · non-contrast
Comparison: Previous exam(s).

CLINICAL DATA: BI-RADS 3 follow-up a LEFT breast mass.

EXAM:
ULTRASOUND OF THE LEFT BREAST

[Series 1: us breast*left* limited inc axilla · 0.05mm/px · 8 of 8 slices shown]
[im 1/8]
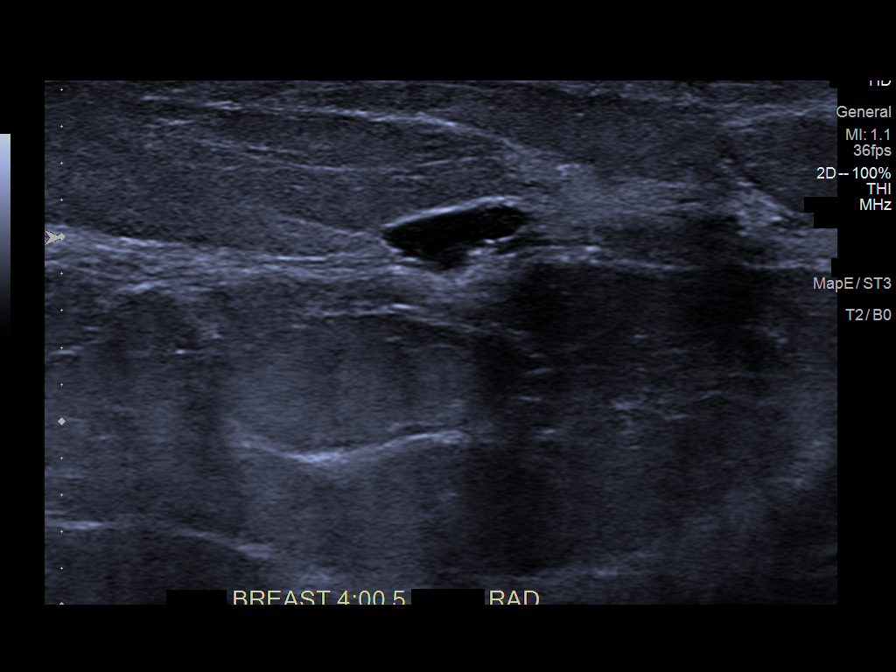
[im 2/8]
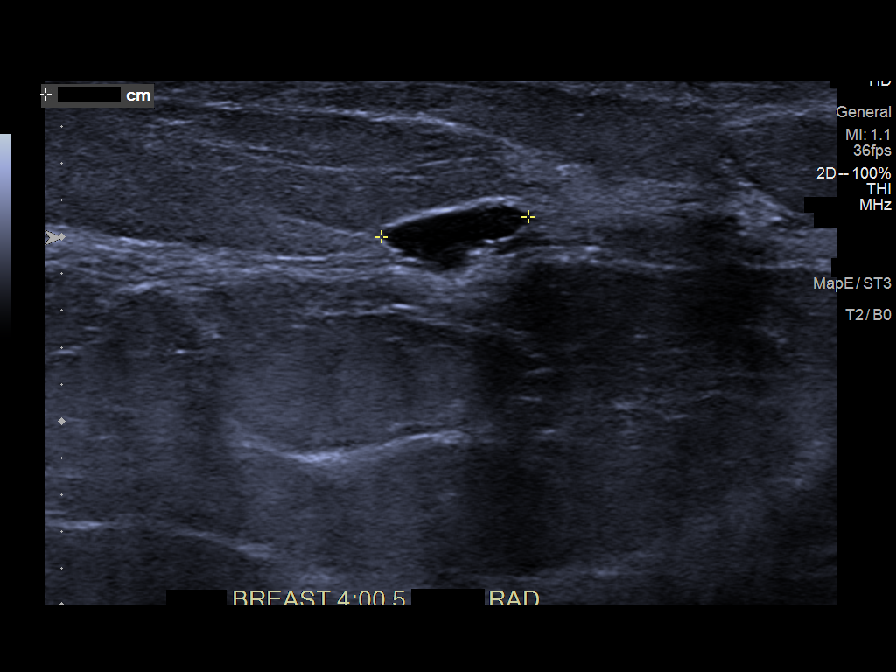
[im 3/8]
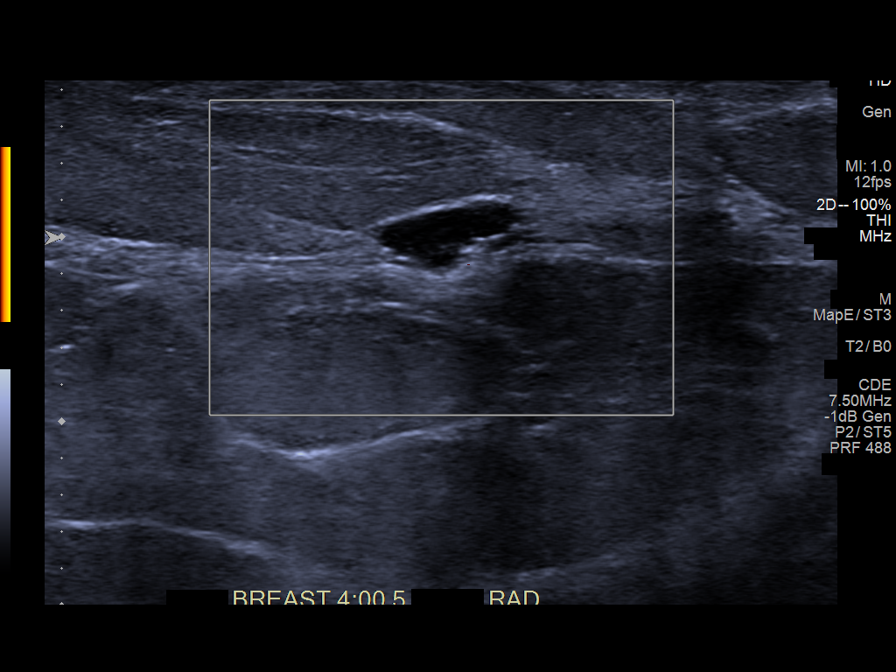
[im 4/8]
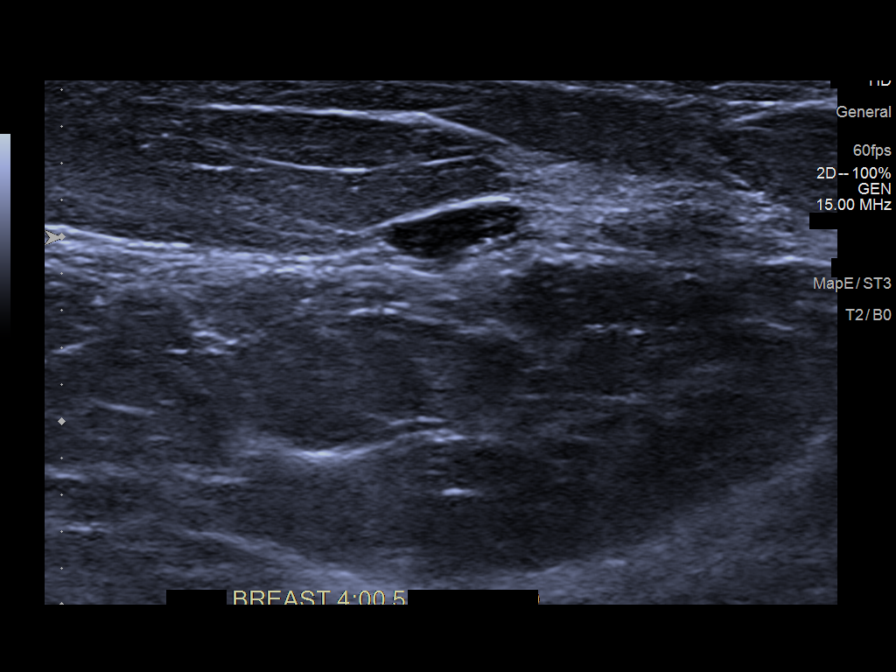
[im 5/8]
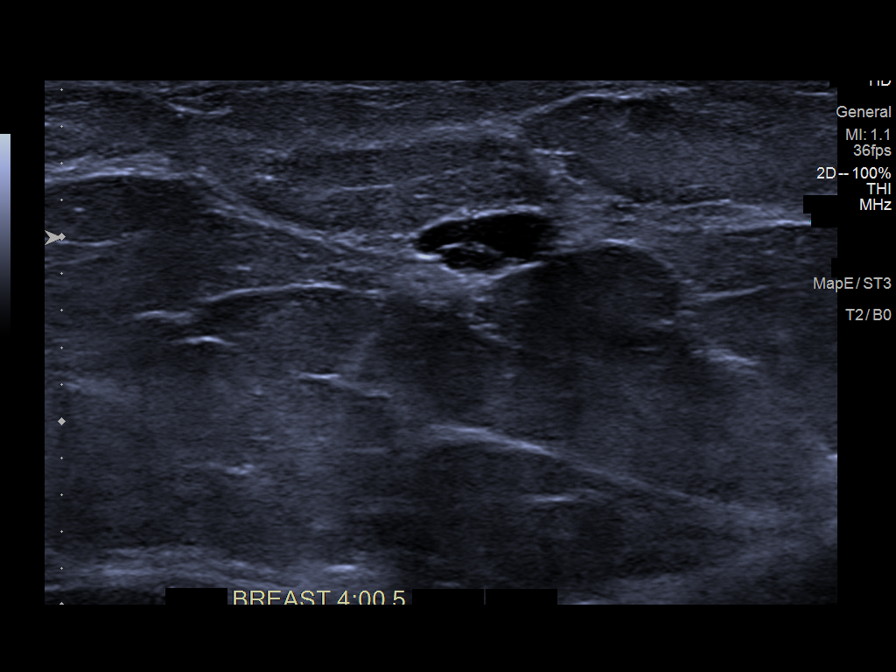
[im 6/8]
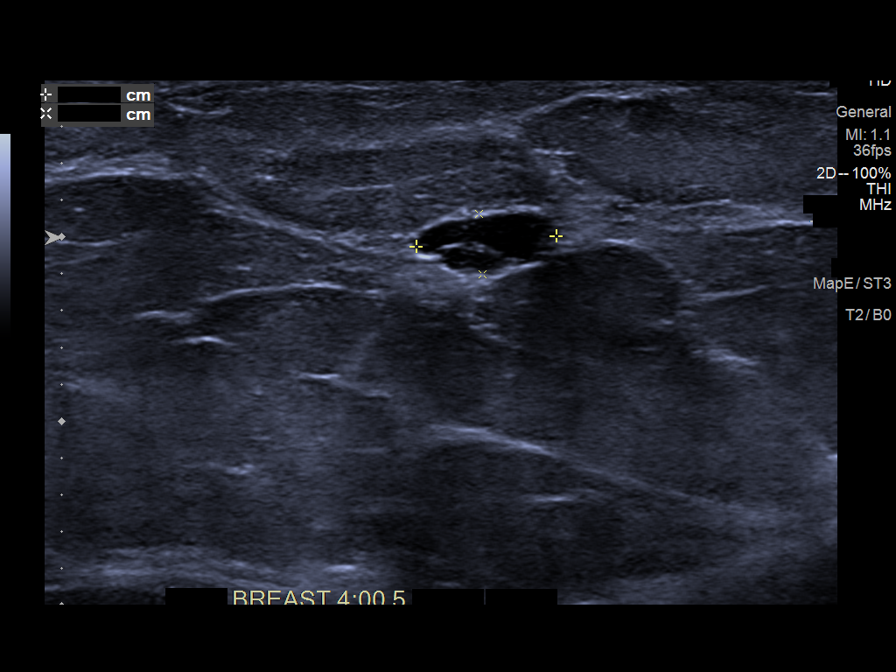
[im 7/8]
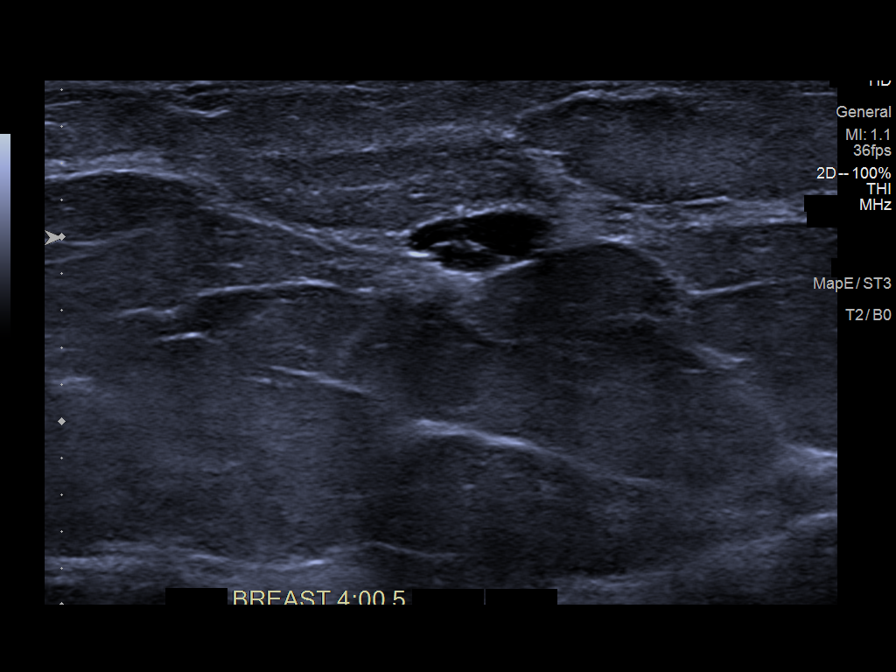
[im 8/8]
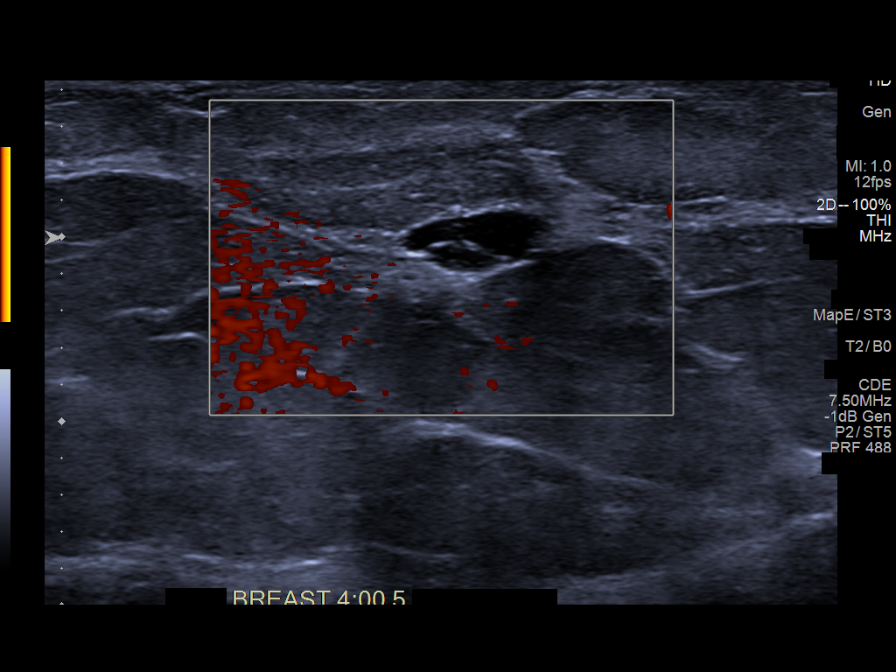

[8 of 8 positions shown; findings below may reference images not displayed]

FINDINGS: Targeted ultrasound was performed of the LEFT breast. At 4 o'clock 5
cm from the nipple, there is revisualization of an oval
circumscribed near anechoic mass with suggestion of posterior
acoustic enhancement and thin internal septations. It measures 8 x 3
by 8 mm, unchanged in comparison to prior.
IMPRESSION: Stable probably benign LEFT breast mass which likely reflects a
cluster of cysts. Recommend follow-up mammogram and ultrasound in 6
months. This will establish 1 year of definitive stability.

RECOMMENDATION:
Bilateral diagnostic mammogram with LEFT breast ultrasound in 6
months. Patient is due for contralateral screening at this point in
time.

I have discussed the findings and recommendations with the patient.
If applicable, a reminder letter will be sent to the patient
regarding the next appointment.

BI-RADS CATEGORY  3: Probably benign.

## 2021-11-01 ENCOUNTER — Other Ambulatory Visit: Payer: Self-pay

## 2021-11-01 ENCOUNTER — Encounter: Payer: Self-pay | Admitting: Gastroenterology

## 2021-11-01 ENCOUNTER — Ambulatory Visit (INDEPENDENT_AMBULATORY_CARE_PROVIDER_SITE_OTHER): Payer: 59 | Admitting: Gastroenterology

## 2021-11-01 VITALS — BP 156/114 | HR 80 | Temp 98.4°F | Ht 69.0 in | Wt 248.0 lb

## 2021-11-01 DIAGNOSIS — K625 Hemorrhage of anus and rectum: Secondary | ICD-10-CM

## 2021-11-01 DIAGNOSIS — G44209 Tension-type headache, unspecified, not intractable: Secondary | ICD-10-CM | POA: Insufficient documentation

## 2021-11-01 DIAGNOSIS — G43909 Migraine, unspecified, not intractable, without status migrainosus: Secondary | ICD-10-CM | POA: Insufficient documentation

## 2021-11-01 MED ORDER — NA SULFATE-K SULFATE-MG SULF 17.5-3.13-1.6 GM/177ML PO SOLN: 354.0000 mL | Freq: Once | ORAL | 0 refills | Status: AC

## 2021-11-01 NOTE — Progress Notes (Signed)
?  ?Cephas Darby, MD ?[address]  ?[address]  ?[place], [place] [address]  ?Main: [phone]  ?Fax: [phone] ? ? ? ?Gastroenterology Consultation ? ?Referring Provider:     [facility] ?Primary Care Physician:  [facility] ?Primary Gastroenterologist:  Dr. Cephas Darby ?Reason for Consultation:     Painless rectal bleeding ?      ? HPI:   ?April Leach is a 46 y.o. female referred by Dr. Shari Prows, [facility]  for consultation & management of painless rectal bleeding.  Patient reports several months history of bright red blood per rectum, with occasional, mild discomfort in the anal canal.  She denies constipation, diarrhea, straining, prolonged sitting on the toilet.  She notices bright red blood per rectum on wiping on a daily basis.  No known history of anemia.  Patient blood pressure was elevated in the office today, she states that that she is nervous about this appointment as well as has been rushing to this appointment.  Patient does not smoke or drink alcohol.  She denies any other GI symptoms.  No known family history of GI malignancy ? ?NSAIDs: None ? ?Antiplts/Anticoagulants/Anti thrombotics: None ? ?GI Procedures: None ? ?Past Medical History:  ?Diagnosis Date  ? Cervical dysplasia   ? ? ?Past Surgical History:  ?Procedure Laterality Date  ? CRYOTHERAPY    ? HIP SURGERY    ? x2  ? ? ?Current Outpatient Medications:  ?  levonorgestrel (MIRENA) 20 MCG/24HR IUD, 1 Intra Uterine Device (1 each total) by Intrauterine route once for 1 dose., Disp: 1 each, Rfl: 0 ?  Na Sulfate-K Sulfate-Mg Sulf 17.5-3.13-1.6 GM/177ML SOLN, Take 354 mLs by mouth once for 1 dose., Disp: 354 mL, Rfl: 0 ? ? ?Family History  ?Problem Relation Age of Onset  ? Cervical cancer Mother 30  ? Hypertension Mother   ? Lung cancer Father 94  ? Parkinson's disease Maternal Grandmother   ?     Family History  ? Melanoma Paternal Grandfather   ? Breast cancer Neg Hx   ?  ? ?Social History   ? ?Tobacco Use  ? Smoking status: Never  ? Smokeless tobacco: Never  ?Vaping Use  ? Vaping Use: Never used  ?Substance Use Topics  ? Alcohol use: Not Currently  ?  Alcohol/week: 0.0 standard drinks  ? Drug use: Never  ? ? ?Allergies as of 11/01/2021 - Review Complete 11/01/2021  ?Allergen Reaction Noted  ? Meperidine Itching 09/16/2015  ? ? ?Review of Systems:    ?All systems reviewed and negative except where noted in HPI. ? ? Physical Exam:  ?BP (!) 156/114 (BP Location: Left Arm, Patient Position: Sitting, Cuff Size: Large)   Pulse 80   Temp 98.4 ?F (36.9 ?C) (Oral)   Ht '5\' 9"'$  (1.753 m)   Wt 248 lb (112.5 kg)   BMI 36.62 kg/m?  ?No LMP recorded. (Menstrual status: IUD). ? ?General:   Alert,  Well-developed, well-nourished, pleasant and cooperative in NAD ?Head:  Normocephalic and atraumatic. ?Eyes:  Sclera clear, no icterus.   Conjunctiva pink. ?Ears:  Normal auditory acuity. ?Nose:  No deformity, discharge, or lesions. ?Mouth:  No deformity or lesions,oropharynx pink & moist. ?Neck:  Supple; no masses or thyromegaly. ?Lungs:  Respirations even and unlabored.  Clear throughout to auscultation.   No wheezes, crackles, or rhonchi. No acute distress. ?Heart:  Regular rate and rhythm; no murmurs, clicks, rubs, or gallops. ?Abdomen:  Normal bowel sounds. Soft,  non-tender and non-distended without masses, hepatosplenomegaly or hernias noted.  No guarding or rebound tenderness.   ?Rectal: Not performed ?Msk:  Symmetrical without gross deformities. Good, equal movement & strength bilaterally. ?Pulses:  Normal pulses noted. ?Extremities:  No clubbing or edema.  No cyanosis. ?Neurologic:  Alert and oriented x3;  grossly normal neurologically. ?Skin:  Intact without significant lesions or rashes. No jaundice. ?Psych:  Alert and cooperative. Normal mood and affect. ? ?Imaging Studies: ?No abdominal imaging ? ?Assessment and Plan:  ? ?April Leach is a 46 y.o. female with history of obesity, BMI 36 is seen in  consultation for chronic history of painless rectal bleeding ? ?Recommend diagnostic colonoscopy for further evaluation.  If this is unremarkable, discussed about possible hemorrhoid ligation ?Check CBC today ? ?Elevated blood pressure ?Patient reports that her blood pressure is elevated only during doctor's visit ?Advised her to keep a log of her blood pressure readings at home and Follow-up with PCP to discuss about antihypertensive medication if necessary ?Recommend low-sodium diet, information provided ? ? ?Follow up based on the colonoscopy results ? ? ?Cephas Darby, MD ? ?

## 2021-11-02 LAB — CBC
Hematocrit: 43.1 % (ref 34.0–46.6)
Hemoglobin: 14.9 g/dL (ref 11.1–15.9)
MCH: 29.6 pg (ref 26.6–33.0)
MCHC: 34.6 g/dL (ref 31.5–35.7)
MCV: 86 fL (ref 79–97)
Platelets: 342 10*3/uL (ref 150–450)
RBC: 5.03 x10E6/uL (ref 3.77–5.28)
RDW: 12.6 % (ref 11.7–15.4)
WBC: 11.3 10*3/uL — ABNORMAL HIGH (ref 3.4–10.8)

## 2021-12-18 ENCOUNTER — Encounter
Admission: RE | Disposition: A | Discharge status: Home / Self Care | Payer: Self-pay | Source: Ambulatory Visit | Attending: Gastroenterology

## 2021-12-18 ENCOUNTER — Ambulatory Visit: Payer: 59 | Admitting: Certified Registered"

## 2021-12-18 ENCOUNTER — Ambulatory Visit
Admission: RE | Admit: 2021-12-18 | Discharge: 2021-12-18 | Disposition: A | Discharge status: Home / Self Care | Payer: 59 | Source: Ambulatory Visit | Attending: Gastroenterology | Admitting: Gastroenterology

## 2021-12-18 DIAGNOSIS — K6289 Other specified diseases of anus and rectum: Secondary | ICD-10-CM | POA: Diagnosis not present

## 2021-12-18 DIAGNOSIS — K635 Polyp of colon: Secondary | ICD-10-CM | POA: Diagnosis not present

## 2021-12-18 DIAGNOSIS — K625 Hemorrhage of anus and rectum: Secondary | ICD-10-CM | POA: Insufficient documentation

## 2021-12-18 DIAGNOSIS — D124 Benign neoplasm of descending colon: Secondary | ICD-10-CM | POA: Diagnosis not present

## 2021-12-18 HISTORY — PX: COLONOSCOPY WITH PROPOFOL: SHX5780

## 2021-12-18 LAB — POCT PREGNANCY, URINE: Preg Test, Ur: NEGATIVE

## 2021-12-18 SURGERY — COLONOSCOPY WITH PROPOFOL
Anesthesia: General

## 2021-12-18 MED ORDER — LABETALOL HCL 5 MG/ML IV SOLN
INTRAVENOUS | Status: AC
  Filled 2021-12-18: qty 4

## 2021-12-18 MED ORDER — DEXMEDETOMIDINE (PRECEDEX) IN NS 20 MCG/5ML (4 MCG/ML) IV SYRINGE
PREFILLED_SYRINGE | INTRAVENOUS | Status: DC | PRN
  Administered 2021-12-18: 8 ug via INTRAVENOUS
  Administered 2021-12-18: 4 ug via INTRAVENOUS

## 2021-12-18 MED ORDER — LIDOCAINE HCL (CARDIAC) PF 100 MG/5ML IV SOSY
PREFILLED_SYRINGE | INTRAVENOUS | Status: DC | PRN
Start: 2021-12-18 — End: 2021-12-18
  Administered 2021-12-18: 50 mg via INTRAVENOUS

## 2021-12-18 MED ORDER — LABETALOL HCL 5 MG/ML IV SOLN
INTRAVENOUS | Status: DC | PRN
  Administered 2021-12-18: 5 mg via INTRAVENOUS

## 2021-12-18 MED ORDER — PROPOFOL 500 MG/50ML IV EMUL
INTRAVENOUS | Status: DC | PRN
Start: 2021-12-18 — End: 2021-12-18
  Administered 2021-12-18: 15 ug/kg/min via INTRAVENOUS

## 2021-12-18 MED ORDER — PROPOFOL 500 MG/50ML IV EMUL
INTRAVENOUS | Status: AC
  Filled 2021-12-18: qty 50

## 2021-12-18 MED ORDER — SODIUM CHLORIDE 0.9 % IV SOLN
INTRAVENOUS | Status: DC
  Administered 2021-12-18: 20 mL/h via INTRAVENOUS

## 2021-12-18 MED ORDER — PROPOFOL 10 MG/ML IV BOLUS
INTRAVENOUS | Status: DC | PRN
  Administered 2021-12-18: 70 mg via INTRAVENOUS
  Administered 2021-12-18: 30 mg via INTRAVENOUS

## 2021-12-18 NOTE — Anesthesia Postprocedure Evaluation (Signed)
Anesthesia Post Note ? ?Patient: April Leach ? ?Procedure(s) Performed: COLONOSCOPY WITH PROPOFOL ? ?Patient location during evaluation: Endoscopy ?Anesthesia Type: General ?Level of consciousness: awake and alert ?Pain management: pain level controlled ?Vital Signs Assessment: post-procedure vital signs reviewed and stable ?Respiratory status: spontaneous breathing, nonlabored ventilation, respiratory function stable and patient connected to nasal cannula oxygen ?Cardiovascular status: blood pressure returned to baseline and stable ?Postop Assessment: no apparent nausea or vomiting ?Anesthetic complications: no ?Comments:  ? ?I once again reiterated to patient the importance of close PCP follow up for her HTN. She is asymptomatic at this time and stable for discharge. ? ? ?No notable events documented. ? ? ?Last Vitals:  ?Vitals:  ? 12/18/21 1040 12/18/21 1047  ?BP:  (!) 141/102  ?Pulse: 76 74  ?Resp: 17 17  ?Temp:    ?SpO2: 98% 100%  ?  ?Last Pain:  ?Vitals:  ? 12/18/21 1023  ?TempSrc: Temporal  ?PainSc:   ? ? ?  ?  ?  ?  ?  ?  ? ?Arita Miss ? ? ? ? ?

## 2021-12-18 NOTE — Transfer of Care (Signed)
Immediate Anesthesia Transfer of Care Note ? ?Patient: April Leach ? ?Procedure(s) Performed: COLONOSCOPY WITH PROPOFOL ? ?Patient Location: PACU and Endoscopy Unit ? ?Anesthesia Type:General ? ?Level of Consciousness: drowsy and patient cooperative ? ?Airway & Oxygen Therapy: Patient Spontanous Breathing ? ?Post-op Assessment: Report given to RN and Post -op Vital signs reviewed and stable ? ?Post vital signs: Reviewed and stable ? ?Last Vitals:  ?Vitals Value Taken Time  ?BP 131/86 12/18/21 1023  ?Temp    ?Pulse 84 12/18/21 1023  ?Resp 19 12/18/21 1023  ?SpO2 98 % 12/18/21 1023  ?Vitals shown include unvalidated device data. ? ?Last Pain:  ?Vitals:  ? 12/18/21 0927  ?TempSrc: Temporal  ?PainSc: 0-No pain  ?   ? ?  ? ?Complications: No notable events documented. ?

## 2021-12-18 NOTE — Anesthesia Procedure Notes (Signed)
Procedure Name: Sherrill ?Date/Time: 12/18/2021 10:09 AM ?Performed by: Jerrye Noble, CRNA ?Pre-anesthesia Checklist: Patient identified, Emergency Drugs available, Suction available and Patient being monitored ?Patient Re-evaluated:Patient Re-evaluated prior to induction ?Oxygen Delivery Method: Nasal cannula ? ? ? ? ?

## 2021-12-18 NOTE — H&P (Signed)
?  Cephas Darby, MD ?[address]  ?[address]  ?[place], [place] [address]  ?Main: [phone]  ?Fax: [phone] ?Pager: [phone] ? ?Primary Care Physician:  [facility] ?Primary Gastroenterologist:  Dr. Cephas Darby ? ?Pre-Procedure History & Physical: ?HPI:  April Leach is a 46 y.o. female is here for an colonoscopy. ?  ?Past Medical History:  ?Diagnosis Date  ? Cervical dysplasia   ? ? ?Past Surgical History:  ?Procedure Laterality Date  ? CRYOTHERAPY    ? HIP SURGERY    ? x2  ? ? ?Prior to Admission medications   ?Medication Sig Start Date End Date Taking? Authorizing Provider  ?levonorgestrel (MIRENA) 20 MCG/24HR IUD 1 Intra Uterine Device (1 each total) by Intrauterine route once for 1 dose. 05/09/20 03/31/45  Copland, Deirdre Evener, PA-C  ? ? ?Allergies as of 11/01/2021 - Review Complete 11/01/2021  ?Allergen Reaction Noted  ? Meperidine Itching 09/16/2015  ? ? ?Family History  ?Problem Relation Age of Onset  ? Cervical cancer Mother 12  ? Hypertension Mother   ? Lung cancer Father 70  ? Parkinson's disease Maternal Grandmother   ?     Family History  ? Melanoma Paternal Grandfather   ? Breast cancer Neg Hx   ? ? ?Social History  ? ?Socioeconomic History  ? Marital status: Married  ?  Spouse name: Not on file  ? Number of children: Not on file  ? Years of education: Not on file  ? Highest education level: Not on file  ?Occupational History  ? Not on file  ?Tobacco Use  ? Smoking status: Never  ? Smokeless tobacco: Never  ?Vaping Use  ? Vaping Use: Never used  ?Substance and Sexual Activity  ? Alcohol use: Not Currently  ?  Alcohol/week: 0.0 standard drinks  ? Drug use: Never  ? Sexual activity: Yes  ?  Birth control/protection: I.U.D.  ?  Comment: Mirena  ?Other Topics Concern  ? Not on file  ?Social History Narrative  ? Not on file  ? ?Social Determinants of Health  ? ?Financial Resource Strain: Not on file  ?Food Insecurity: Not on file  ?Transportation Needs: Not on file  ?Physical  Activity: Not on file  ?Stress: Not on file  ?Social Connections: Not on file  ?Intimate Partner Violence: Not on file  ? ? ?Review of Systems: ?See HPI, otherwise negative ROS ? ?Physical Exam: ?BP (!) 168/114   Pulse (!) 108   Temp 98.1 ?F (36.7 ?C) (Temporal)   Resp 20   Ht '5\' 9"'$  (1.753 m)   Wt 113.4 kg   SpO2 100%   BMI 36.92 kg/m?  ?General:   Alert,  pleasant and cooperative in NAD ?Head:  Normocephalic and atraumatic. ?Neck:  Supple; no masses or thyromegaly. ?Lungs:  Clear throughout to auscultation.    ?Heart:  Regular rate and rhythm. ?Abdomen:  Soft, nontender and nondistended. Normal bowel sounds, without guarding, and without rebound.   ?Neurologic:  Alert and  oriented x4;  grossly normal neurologically. ? ?Impression/Plan: ?Sharese D Mitchell is here for an colonoscopy to be performed for painless rectal bleeding ? ?Risks, benefits, limitations, and alternatives regarding  colonoscopy have been reviewed with the patient.  Questions have been answered.  All parties agreeable. ? ? ?Sherri Sear, MD  12/18/2021, 9:43 AM ?

## 2021-12-18 NOTE — Anesthesia Preprocedure Evaluation (Signed)
Anesthesia Evaluation  ?Patient identified by MRN, date of birth, ID band ?Patient awake ? ?General Assessment Comment:PONV 30 years ago, no anesthetics since. ? ?Reviewed: ?Allergy & Precautions, NPO status , Patient's Chart, lab work & pertinent test results ? ?History of Anesthesia Complications ?(+) PONV and history of anesthetic complications ? ?Airway ?Mallampati: II ? ?TM Distance: >3 FB ?Neck ROM: Full ? ? ? Dental ?no notable dental hx. ?(+) Teeth Intact ?  ?Pulmonary ?neg pulmonary ROS, neg sleep apnea, neg COPD, Patient abstained from smoking.Not current smoker,  ?  ?Pulmonary exam normal ?breath sounds clear to auscultation ? ? ? ? ? ? Cardiovascular ?Exercise Tolerance: Good ?METS(-) hypertension(-) CAD and (-) Past MI negative cardio ROS ? ?(-) dysrhythmias  ?Rhythm:Regular Rate:Normal ?- Systolic murmurs ? ?  ?Neuro/Psych ? Headaches, negative psych ROS  ? GI/Hepatic ?neg GERD  ,(+)  ?  ? (-) substance abuse ? ,   ?Endo/Other  ?neg diabetes ? Renal/GU ?negative Renal ROS  ? ?  ?Musculoskeletal ? ? Abdominal ?  ?Peds ? Hematology ?  ?Anesthesia Other Findings ?Past Medical History: ?No date: Cervical dysplasia ? Reproductive/Obstetrics ? ?  ? ? ? ? ? ? ? ? ? ? ? ? ? ?  ?  ? ? ? ? ? ? ? ? ?Anesthesia Physical ?Anesthesia Plan ? ?ASA: 2 ? ?Anesthesia Plan: General  ? ?Post-op Pain Management: Minimal or no pain anticipated  ? ?Induction: Intravenous ? ?PONV Risk Score and Plan: 4 or greater and Propofol infusion, TIVA and Ondansetron ? ?Airway Management Planned: Nasal Cannula ? ?Additional Equipment: None ? ?Intra-op Plan:  ? ?Post-operative Plan:  ? ?Informed Consent: I have reviewed the patients History and Physical, chart, labs and discussed the procedure including the risks, benefits and alternatives for the proposed anesthesia with the patient or authorized representative who has indicated his/her understanding and acceptance.  ? ? ? ?Dental advisory given ? ?Plan  Discussed with: CRNA and Surgeon ? ?Anesthesia Plan Comments: (Discussed risks of anesthesia with patient, including possibility of difficulty with spontaneous ventilation under anesthesia necessitating airway intervention, PONV, and rare risks such as cardiac or respiratory or neurological events, and allergic reactions. Discussed the role of CRNA in patient's perioperative care. Patient understands.)  ? ? ? ? ? ? ?Anesthesia Quick Evaluation ? ?

## 2021-12-18 NOTE — Op Note (Signed)
[facility] ?Gastroenterology ?Patient Name: April Leach ?Procedure Date: 12/18/2021 9:43 AM ?MRN: [id number] ?Account #: [id number] ?Date of Birth: 30-Apr-1976 ?Admit Type: Outpatient ?Age: 46 ?Room: [facility] ENDO ROOM 1 ?Gender: Female ?Note Status: Finalized ?Instrument Name: Colonoscope [id number] ?Procedure:             Colonoscopy ?Indications:           This is the patient's first colonoscopy, Rectal  ?                       bleeding ?Providers:             Lin Landsman MD, MD ?Medicines:             General Anesthesia ?Complications:         No immediate complications. Estimated blood loss:  ?                       Minimal. ?Procedure:             Pre-Anesthesia Assessment: ?                       - Prior to the procedure, a History and Physical was  ?                       performed, and patient medications and allergies were  ?                       reviewed. The patient is competent. The risks and  ?                       benefits of the procedure and the sedation options and  ?                       risks were discussed with the patient. All questions  ?                       were answered and informed consent was obtained.  ?                       Patient identification and proposed procedure were  ?                       verified by the physician, the nurse, the  ?                       anesthesiologist, the anesthetist and the technician  ?                       in the pre-procedure area in the procedure room in the  ?                       endoscopy suite. Mental Status Examination: alert and  ?                       oriented. Airway Examination: normal oropharyngeal  ?                       airway and neck mobility. Respiratory Examination:  ?  clear to auscultation. CV Examination: normal.  ?                       Prophylactic Antibiotics: The patient does not require  ?                       prophylactic antibiotics. Prior Anticoagulants: The  ?                        patient has taken no previous anticoagulant or  ?                       antiplatelet agents. ASA Grade Assessment: II - A  ?                       patient with mild systemic disease. After reviewing  ?                       the risks and benefits, the patient was deemed in  ?                       satisfactory condition to undergo the procedure. The  ?                       anesthesia plan was to use general anesthesia.  ?                       Immediately prior to administration of medications,  ?                       the patient was re-assessed for adequacy to receive  ?                       sedatives. The heart rate, respiratory rate, oxygen  ?                       saturations, blood pressure, adequacy of pulmonary  ?                       ventilation, and response to care were monitored  ?                       throughout the procedure. The physical status of the  ?                       patient was re-assessed after the procedure. ?                       After obtaining informed consent, the colonoscope was  ?                       passed under direct vision. Throughout the procedure,  ?                       the patient's blood pressure, pulse, and oxygen  ?                       saturations were monitored continuously. The  ?  Colonoscope was introduced through the anus and  ?                       advanced to the the terminal ileum, with  ?                       identification of the appendiceal orifice and IC  ?                       valve. The colonoscopy was performed without  ?                       difficulty. The patient tolerated the procedure well.  ?                       The quality of the bowel preparation was evaluated  ?                       using the BBPS [facility] Bowel Preparation Scale) with  ?                       scores of: Right Colon = 3, Transverse Colon = 3 and  ?                       Left Colon = 3 (entire mucosa seen well with no  ?                        residual staining, small fragments of stool or opaque  ?                       liquid). The total BBPS score equals 9. ?Findings: ?     The perianal and digital rectal examinations were normal. Pertinent  ?     negatives include normal sphincter tone and no palpable rectal lesions. ?     The terminal ileum appeared normal. ?     A 5 mm polyp was found in the descending colon. The polyp was sessile.  ?     The polyp was removed with a cold snare. Resection and retrieval were  ?     complete. Estimated blood loss: none. ?     Diffuse moderate inflammation characterized by altered vascularity,  ?     congestion (edema), erythema, friability and shallow ulcerations was  ?     found in the mid rectum and in the distal rectum. Biopsies were taken  ?     with a cold forceps for histology. ?Impression:            - The examined portion of the ileum was normal. ?                       - One 5 mm polyp in the descending colon, removed with  ?                       a cold snare. Resected and retrieved. ?                       - Diffuse moderate inflammation was found in the mid  ?  rectum and in the distal rectum secondary to  ?                       proctitis. Biopsied. ?Recommendation:        - Discharge patient to home (with escort). ?                       - Resume previous diet today. ?                       - Continue present medications. ?                       - Await pathology results. ?                       - Return to my office. ?Procedure Code(s):     --- Professional --- ?                       [id number], Colonoscopy, flexible; with removal of  ?                       tumor(s), polyp(s), or other lesion(s) by snare  ?                       technique ?                       45380, 59, Colonoscopy, flexible; with biopsy, single  ?                       or multiple ?Diagnosis Code(s):     --- Professional --- ?                       K63.5, Polyp of colon ?                       K62.89, Other  specified diseases of anus and rectum ?                       K62.5, Hemorrhage of anus and rectum ?CPT copyright 2019 [employer]. All rights reserved. ?The codes documented in this report are preliminary and upon coder review may  ?be revised to meet current compliance requirements. ?Dr. Ulyess Mort ?Dahl Higinbotham Raeanne Gathers MD, MD ?12/18/2021 10:21:27 AM ?This report has been signed electronically. ?Number of Addenda: 0 ?Note Initiated On: 12/18/2021 9:43 AM ?Scope Withdrawal Time: 0 hours 9 minutes 56 seconds  ?Total Procedure Duration: 0 hours 14 minutes 19 seconds  ?Estimated Blood Loss:  Estimated blood loss was minimal. ?     [facility] ?

## 2021-12-19 ENCOUNTER — Other Ambulatory Visit: Payer: Self-pay | Admitting: Gastroenterology

## 2021-12-19 ENCOUNTER — Encounter: Payer: Self-pay | Admitting: Gastroenterology

## 2021-12-19 DIAGNOSIS — K51211 Ulcerative (chronic) proctitis with rectal bleeding: Secondary | ICD-10-CM

## 2021-12-19 LAB — SURGICAL PATHOLOGY

## 2021-12-19 MED ORDER — MESALAMINE 1000 MG RE SUPP: 1000.0000 mg | Freq: Every day | RECTAL | 1 refills | Status: DC

## 2022-01-21 ENCOUNTER — Other Ambulatory Visit: Payer: Self-pay | Admitting: Gastroenterology

## 2022-01-21 DIAGNOSIS — K51211 Ulcerative (chronic) proctitis with rectal bleeding: Secondary | ICD-10-CM
